# Patient Record
Sex: Female | Born: 1980 | Race: Black or African American | Hispanic: No | Marital: Single | Smoking: Current every day smoker
Health system: Southern US, Community
[De-identification: ages and names within clinical notes are randomized; demographics above are authoritative.]

---

## 2018-02-10 ENCOUNTER — Encounter (HOSPITAL_COMMUNITY): Payer: Self-pay | Admitting: Emergency Medicine

## 2018-02-10 ENCOUNTER — Emergency Department (HOSPITAL_COMMUNITY): Payer: BLUE CROSS/BLUE SHIELD

## 2018-02-10 ENCOUNTER — Emergency Department (HOSPITAL_COMMUNITY)
Admission: EM | Admit: 2018-02-10 | Discharge: 2018-02-10 | Disposition: A | Discharge status: Home / Self Care | Payer: BLUE CROSS/BLUE SHIELD | Attending: Emergency Medicine | Admitting: Emergency Medicine

## 2018-02-10 DIAGNOSIS — R10814 Left lower quadrant abdominal tenderness: Secondary | ICD-10-CM | POA: Diagnosis not present

## 2018-02-10 DIAGNOSIS — F17210 Nicotine dependence, cigarettes, uncomplicated: Secondary | ICD-10-CM | POA: Insufficient documentation

## 2018-02-10 DIAGNOSIS — R102 Pelvic and perineal pain: Secondary | ICD-10-CM | POA: Insufficient documentation

## 2018-02-10 DIAGNOSIS — R110 Nausea: Secondary | ICD-10-CM | POA: Insufficient documentation

## 2018-02-10 LAB — COMPREHENSIVE METABOLIC PANEL
ALBUMIN: 4 g/dL (ref 3.5–5.0)
ALK PHOS: 70 U/L (ref 38–126)
ALT: 16 U/L (ref 0–44)
ANION GAP: 8 (ref 5–15)
AST: 22 U/L (ref 15–41)
BILIRUBIN TOTAL: 0.7 mg/dL (ref 0.3–1.2)
BUN: 8 mg/dL (ref 6–20)
CALCIUM: 8.6 mg/dL — AB (ref 8.9–10.3)
CO2: 24 mmol/L (ref 22–32)
CREATININE: 0.76 mg/dL (ref 0.44–1.00)
Chloride: 106 mmol/L (ref 98–111)
GFR calc Af Amer: >60 mL/min (ref >60)
GFR calc non Af Amer: >60 mL/min (ref >60)
GLUCOSE: 100 mg/dL — AB (ref 70–99)
Potassium: 3.6 mmol/L (ref 3.5–5.1)
Sodium: 138 mmol/L (ref 135–145)
TOTAL PROTEIN: 7.2 g/dL (ref 6.5–8.1)

## 2018-02-10 LAB — URINALYSIS, ROUTINE W REFLEX MICROSCOPIC
Bacteria, UA: NONE SEEN
Bilirubin Urine: NEGATIVE
GLUCOSE, UA: NEGATIVE mg/dL
Ketones, ur: NEGATIVE mg/dL
Leukocytes, UA: NEGATIVE
Nitrite: NEGATIVE
PH: 5 (ref 5.0–8.0)
PROTEIN: NEGATIVE mg/dL
Specific Gravity, Urine: 1.028 (ref 1.005–1.030)

## 2018-02-10 LAB — CBC
HCT: 38.7 % (ref 36.0–46.0)
Hemoglobin: 12.4 g/dL (ref 12.0–15.0)
MCH: 30.5 pg (ref 26.0–34.0)
MCHC: 32 g/dL (ref 30.0–36.0)
MCV: 95.1 fL (ref 80.0–100.0)
PLATELETS: 203 10*3/uL (ref 150–400)
RBC: 4.07 MIL/uL (ref 3.87–5.11)
RDW: 14.4 % (ref 11.5–15.5)
WBC: 5.1 10*3/uL (ref 4.0–10.5)
nRBC: 0 % (ref 0.0–0.2)

## 2018-02-10 LAB — I-STAT BETA HCG BLOOD, ED (MC, WL, AP ONLY): I-stat hCG, quantitative: <5 m[IU]/mL (ref <5)

## 2018-02-10 LAB — LIPASE, BLOOD: Lipase: 23 U/L (ref 11–51)

## 2018-02-10 NOTE — ED Provider Notes (Signed)
[facility]-EMERGENCY DEPT Provider Note   CSN: [id number] Arrival date & time: 02/10/18  1146     History   Chief Complaint Chief Complaint  Patient presents with  . Abdominal Pain  . Nausea    HPI Robin Villanueva is a 37 y.o. female who presents to ED for gradually worsening, intermittent and left lower abdominal pain/pelvic pain for the past week.  States that she has had intermittent pain over the past year.  No specific aggravating or alleviating factors noted.  Pain is sharp, will sometimes radiate down her left leg.  She notes that she is not had a normal menstrual cycle since September 2019.  States that she will have intermittent spotting.  Reports nausea but no vomiting.  No changes to bowel movements, urinary symptoms, fever.  Prior abdominal surgeries include ovarian cyst removal but unsure of laterality.  Has been taking intermittent Tylenol with only mild improvement in symptoms.  Reports occasional alcohol use, daily tobacco use.  Denies any other drug use or chronic NSAID use, vaginal discharge.  HPI  History reviewed. No pertinent past medical history.  There are no active problems to display for this patient.   History reviewed. No pertinent surgical history.   OB History   None      Home Medications    Prior to Admission medications   Not on File    Family History No family history on file.  Social History Social History   Tobacco Use  . Smoking status: Current Every Day Smoker  . Smokeless tobacco: Never Used  Substance Use Topics  . Alcohol use: Never    Frequency: Never  . Drug use: Never     Allergies   Patient has no allergy information on record.   Review of Systems Review of Systems  Constitutional: Negative for appetite change, chills and fever.  HENT: Negative for ear pain, rhinorrhea, sneezing and sore throat.   Eyes: Negative for photophobia and visual disturbance.  Respiratory: Negative for cough,  chest tightness, shortness of breath and wheezing.   Cardiovascular: Negative for chest pain and palpitations.  Gastrointestinal: Positive for abdominal pain and nausea. Negative for blood in stool, constipation, diarrhea and vomiting.  Genitourinary: Negative for dysuria, hematuria, urgency and vaginal bleeding.       +spotting  Musculoskeletal: Negative for myalgias.  Skin: Negative for rash.  Neurological: Negative for dizziness, weakness and light-headedness.     Physical Exam Updated Vital Signs BP 139/86 (BP Location: Left Arm)   Pulse 64   Temp 98.8 F (37.1 C) (Oral)   Resp 18   Ht 5\' 6"  (1.676 m)   Wt 90.7 kg   SpO2 100%   BMI 32.28 kg/m   Physical Exam  Constitutional: She appears well-developed and well-nourished. No distress.  Nontoxic-appearing and in no acute distress.  HENT:  Head: Normocephalic and atraumatic.  Nose: Nose normal.  Eyes: Conjunctivae and EOM are normal. Left eye exhibits no discharge. No scleral icterus.  Neck: Normal range of motion. Neck supple.  Cardiovascular: Normal rate, regular rhythm, normal heart sounds and intact distal pulses. Exam reveals no gallop and no friction rub.  No murmur heard. Pulmonary/Chest: Effort normal and breath sounds normal. No respiratory distress.  Abdominal: Soft. Bowel sounds are normal. She exhibits no distension. There is tenderness in the left lower quadrant. There is no rebound and no guarding.  Musculoskeletal: Normal range of motion. She exhibits no edema.  Neurological: She is alert. She exhibits normal muscle  tone. Coordination normal.  Skin: Skin is warm and dry. No rash noted.  Psychiatric: She has a normal mood and affect.  Nursing note and vitals reviewed.    ED Treatments / Results  Labs (all labs ordered are listed, but only abnormal results are displayed) Labs Reviewed  COMPREHENSIVE METABOLIC PANEL - Abnormal; Notable for the following components:      Result Value   Glucose, Bld 100  (*)    Calcium 8.6 (*)    All other components within normal limits  URINALYSIS, ROUTINE W REFLEX MICROSCOPIC - Abnormal; Notable for the following components:   APPearance HAZY (*)    Hgb urine dipstick MODERATE (*)    All other components within normal limits  LIPASE, BLOOD  CBC  I-STAT BETA HCG BLOOD, ED ([facility], [facility], [facility] ONLY)    EKG None  Radiology US Transvaginal Non-ob  Result Date: 02/10/2018 CLINICAL DATA:  Left lower quadrant pain. EXAM: TRANSABDOMINAL AND TRANSVAGINAL ULTRASOUND OF PELVIS DOPPLER ULTRASOUND OF OVARIES TECHNIQUE: Both transabdominal and transvaginal ultrasound examinations of the pelvis were performed. Transabdominal technique was performed for global imaging of the pelvis including uterus, ovaries, adnexal regions, and pelvic cul-de-sac. It was necessary to proceed with endovaginal exam following the transabdominal exam to visualize the endometrium and ovaries. Color and duplex Doppler ultrasound was utilized to evaluate blood flow to the ovaries. COMPARISON:  None. FINDINGS: Uterus Measurements: 9.0 x 4.9 x 5.6 cm = volume: 130.09 mL. Nabothian cysts in the cervix. The uterus is anteverted. No other suspicious findings. Endometrium Thickness: 8.4 mm.  No focal abnormality visualized. Right ovary Measurements: 4.99 x 3.83 x 3.78 cm = volume: 37.83 mL. Contains a 4.2 x 3.8 x 3.1 cm simple cyst. Left ovary Measurements: 3.55 x 2.09 x 2.07 cm = volume: 8.04 mL. Normal appearance/no adnexal mass. Pulsed Doppler evaluation of both ovaries demonstrates normal low-resistance arterial and venous waveforms. Other findings No other significant abnormalities. IMPRESSION: 1. There is a dominant cyst measuring up to 4.2 cm in the right ovary. This has benign characteristics and is a common finding in premenopausal females. No imaging follow up is required. This follows consensus guidelines: Simple Adnexal Cysts: SRU Consensus Conference Update on Follow-up and Reporting. Radiology 2019;  [id number]. 2. No cause for left lower quadrant pain identified. 3. No other significant abnormalities. Electronically Signed   By: Gerome Sam III M.D   On: 02/10/2018 19:21   US Pelvis Complete  Result Date: 02/10/2018 CLINICAL DATA:  Left lower quadrant pain. EXAM: TRANSABDOMINAL AND TRANSVAGINAL ULTRASOUND OF PELVIS DOPPLER ULTRASOUND OF OVARIES TECHNIQUE: Both transabdominal and transvaginal ultrasound examinations of the pelvis were performed. Transabdominal technique was performed for global imaging of the pelvis including uterus, ovaries, adnexal regions, and pelvic cul-de-sac. It was necessary to proceed with endovaginal exam following the transabdominal exam to visualize the endometrium and ovaries. Color and duplex Doppler ultrasound was utilized to evaluate blood flow to the ovaries. COMPARISON:  None. FINDINGS: Uterus Measurements: 9.0 x 4.9 x 5.6 cm = volume: 130.09 mL. Nabothian cysts in the cervix. The uterus is anteverted. No other suspicious findings. Endometrium Thickness: 8.4 mm.  No focal abnormality visualized. Right ovary Measurements: 4.99 x 3.83 x 3.78 cm = volume: 37.83 mL. Contains a 4.2 x 3.8 x 3.1 cm simple cyst. Left ovary Measurements: 3.55 x 2.09 x 2.07 cm = volume: 8.04 mL. Normal appearance/no adnexal mass. Pulsed Doppler evaluation of both ovaries demonstrates normal low-resistance arterial and venous waveforms. Other findings No other significant abnormalities. IMPRESSION:  1. There is a dominant cyst measuring up to 4.2 cm in the right ovary. This has benign characteristics and is a common finding in premenopausal females. No imaging follow up is required. This follows consensus guidelines: Simple Adnexal Cysts: SRU Consensus Conference Update on Follow-up and Reporting. Radiology 2019; [id number]. 2. No cause for left lower quadrant pain identified. 3. No other significant abnormalities. Electronically Signed   By: Gerome Sam III M.D   On: 02/10/2018 19:21   [place]  Art/ven Flow Abd Pelv Doppler  Result Date: 02/10/2018 CLINICAL DATA:  Left lower quadrant pain. EXAM: TRANSABDOMINAL AND TRANSVAGINAL ULTRASOUND OF PELVIS DOPPLER ULTRASOUND OF OVARIES TECHNIQUE: Both transabdominal and transvaginal ultrasound examinations of the pelvis were performed. Transabdominal technique was performed for global imaging of the pelvis including uterus, ovaries, adnexal regions, and pelvic cul-de-sac. It was necessary to proceed with endovaginal exam following the transabdominal exam to visualize the endometrium and ovaries. Color and duplex Doppler ultrasound was utilized to evaluate blood flow to the ovaries. COMPARISON:  None. FINDINGS: Uterus Measurements: 9.0 x 4.9 x 5.6 cm = volume: 130.09 mL. Nabothian cysts in the cervix. The uterus is anteverted. No other suspicious findings. Endometrium Thickness: 8.4 mm.  No focal abnormality visualized. Right ovary Measurements: 4.99 x 3.83 x 3.78 cm = volume: 37.83 mL. Contains a 4.2 x 3.8 x 3.1 cm simple cyst. Left ovary Measurements: 3.55 x 2.09 x 2.07 cm = volume: 8.04 mL. Normal appearance/no adnexal mass. Pulsed Doppler evaluation of both ovaries demonstrates normal low-resistance arterial and venous waveforms. Other findings No other significant abnormalities. IMPRESSION: 1. There is a dominant cyst measuring up to 4.2 cm in the right ovary. This has benign characteristics and is a common finding in premenopausal females. No imaging follow up is required. This follows consensus guidelines: Simple Adnexal Cysts: SRU Consensus Conference Update on Follow-up and Reporting. Radiology 2019; [id number]. 2. No cause for left lower quadrant pain identified. 3. No other significant abnormalities. Electronically Signed   By: Gerome Sam III M.D   On: 02/10/2018 19:21    Procedures Procedures (including critical care time)  Medications Ordered in ED Medications - No data to display   Initial Impression / Assessment and Plan / ED Course   I have reviewed the triage vital signs and the nursing notes.  Pertinent labs & imaging results that were available during my care of the patient were reviewed by me and considered in my medical decision making (see chart for details).     37yo F presents to ED for evaluation of gradually worsening, intermittent LLQ/pelvic pain for the past week. Symptoms have been intermittent for the past year. She reports not having a normal menstrual cycle since Sept. Only spotting occasionally. She reports nausea but no vomiting. NO changes to bowel movements, urinary symptoms or vaginal discharge. On exam there is some LLQ ttp without rebound or guarding. No CVA tenderness. She is overall well appearing and afebrile. Labwork shows normal CBC, CMP, lipase and hcg. Pelvic ultrasound shows 4.2cm RIGHT ovarian cyst but no left sided findings. UA shows moderate hemoglobin but otherwise unremarkable. Spoke to the patient regarding further imaging.  She would rather follow-up with her OB/GYN and primary care provider.  I feel this is reasonable based on her reassuring lab work, vital signs and overall appearance.  Will advise her to return to ED for any severe worsening symptoms.  Patient is hemodynamically stable, in NAD, and able to ambulate in the ED. Evaluation does not show pathology that  would require ongoing emergent intervention or inpatient treatment. I explained the diagnosis to the patient. Pain has been managed and has no complaints prior to discharge. Patient is comfortable with above plan and is stable for discharge at this time. All questions were answered prior to disposition. Strict return precautions for returning to the ED were discussed. Encouraged follow up with PCP.    Portions of this note were generated with Scientist, clinical (histocompatibility and immunogenetics). Dictation errors may occur despite best attempts at proofreading.   Final Clinical Impressions(s) / ED Diagnoses   Final diagnoses:  Pelvic pain in female     ED Discharge Orders    None       Dietrich Pates, [place] 02/10/18 1959    Lorre Nick, MD 02/13/18 1600

## 2018-02-10 NOTE — ED Notes (Signed)
Patient Ultrasound being performed at bedside.

## 2018-02-10 NOTE — Discharge Instructions (Signed)
Return to ED for worsening symptoms, vomiting or coughing up blood, chest pain, shortness of breath.

## 2018-02-10 NOTE — ED Triage Notes (Signed)
Patient here from home with complaints of left sided abd pain x1 week. Nausea, no vomiting.

## 2020-03-17 IMAGING — US US ART/VEN ABD/PELV/SCROTUM DOPPLER LTD
1 series · 13 of 25 positions shown · non-contrast
Comparison: None.

CLINICAL DATA: Left lower quadrant pain.

EXAM:
TRANSABDOMINAL AND TRANSVAGINAL ULTRASOUND OF PELVIS
DOPPLER ULTRASOUND OF OVARIES
TECHNIQUE: Both transabdominal and transvaginal ultrasound examinations of the
pelvis were performed. Transabdominal technique was performed for
global imaging of the pelvis including uterus, ovaries, adnexal
regions, and pelvic cul-de-sac.
It was necessary to proceed with endovaginal exam following the
transabdominal exam to visualize the endometrium and ovaries. Color
and duplex Doppler ultrasound was utilized to evaluate blood flow to
the ovaries.

[Series 1: us art/ven abd/pelv/scrotum doppler ltd · 13 of 126 slices shown]
[im 1/126]
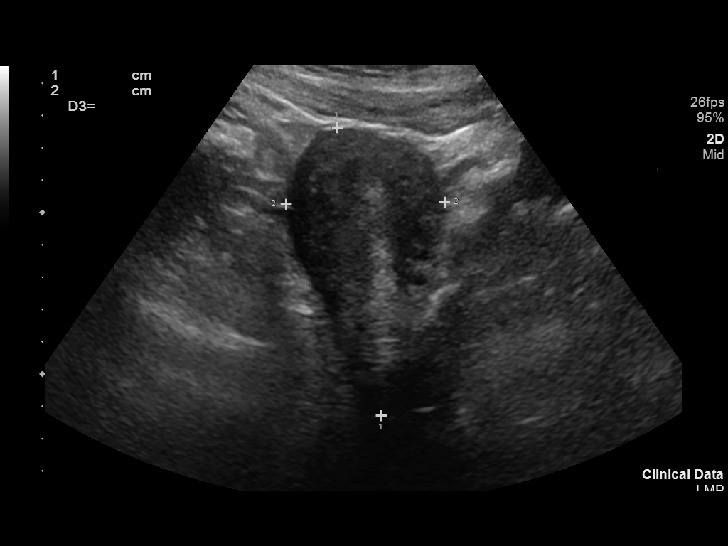
[im 11/126]
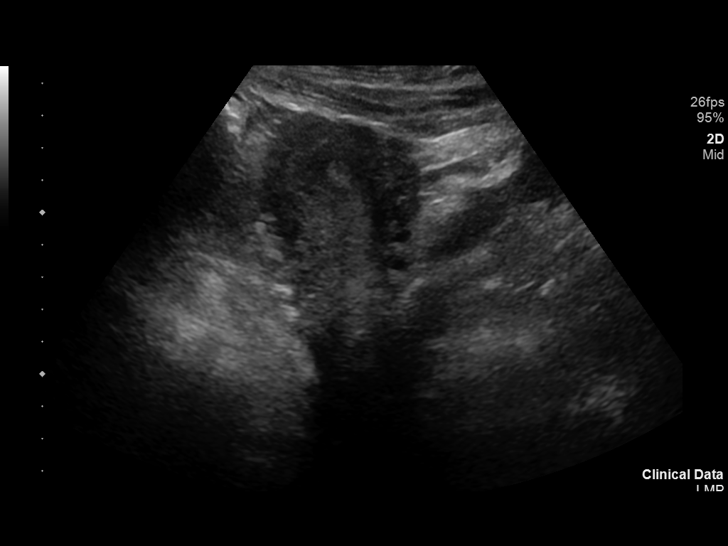
[im 21/126]
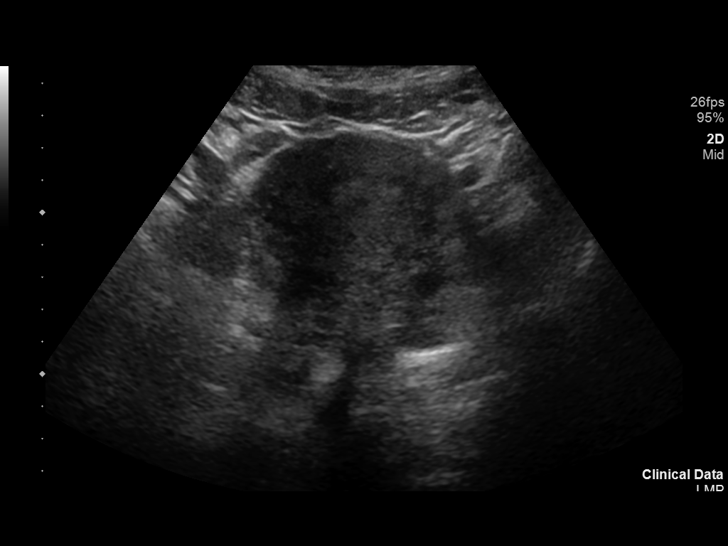
[im 32/126]
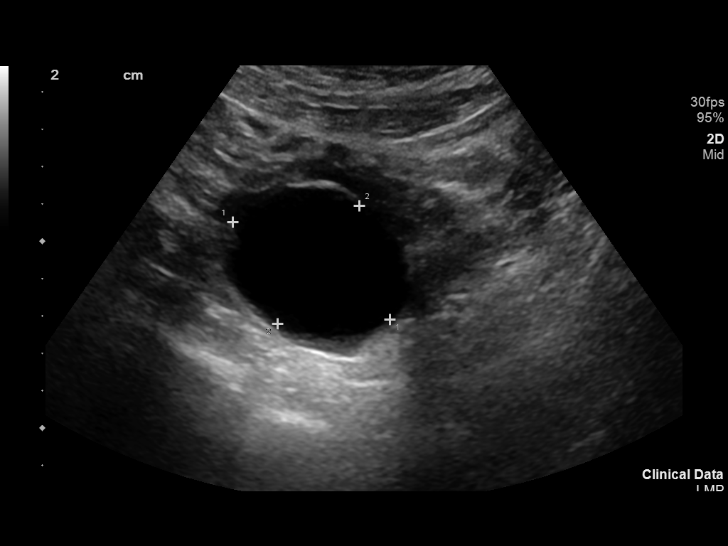
[im 42/126]
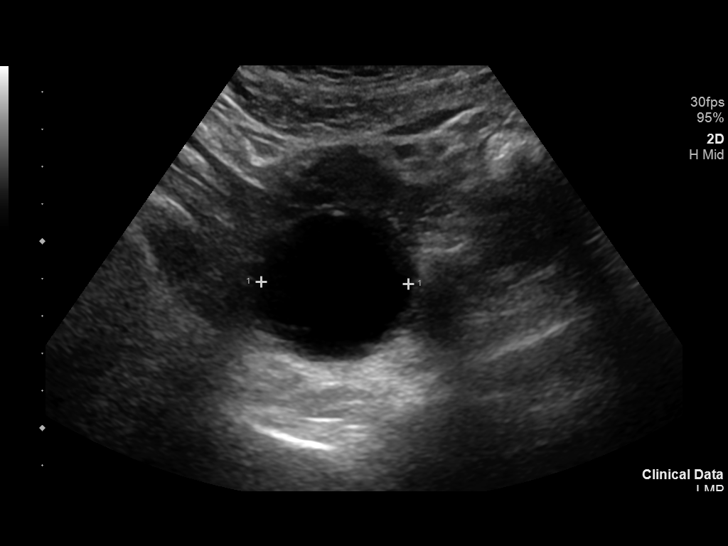
[im 53/126]
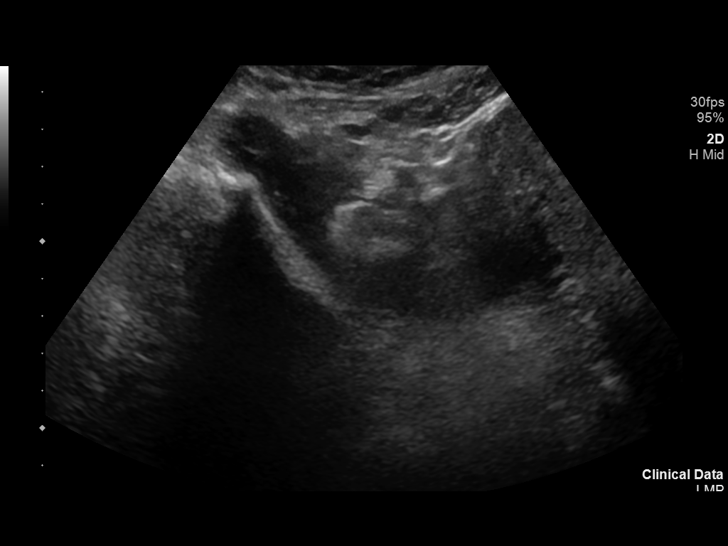
[im 63/126]
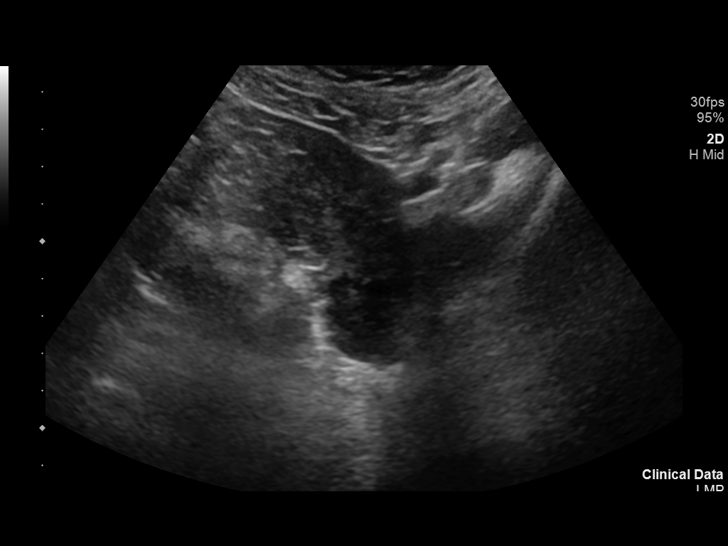
[im 73/126]
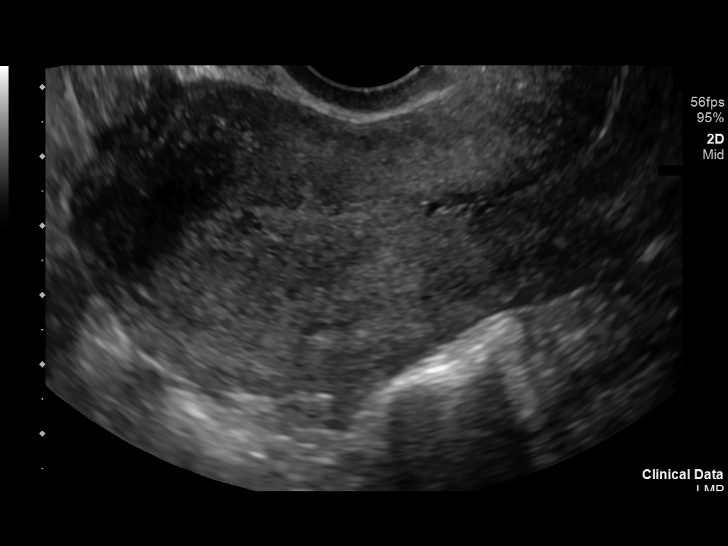
[im 84/126]
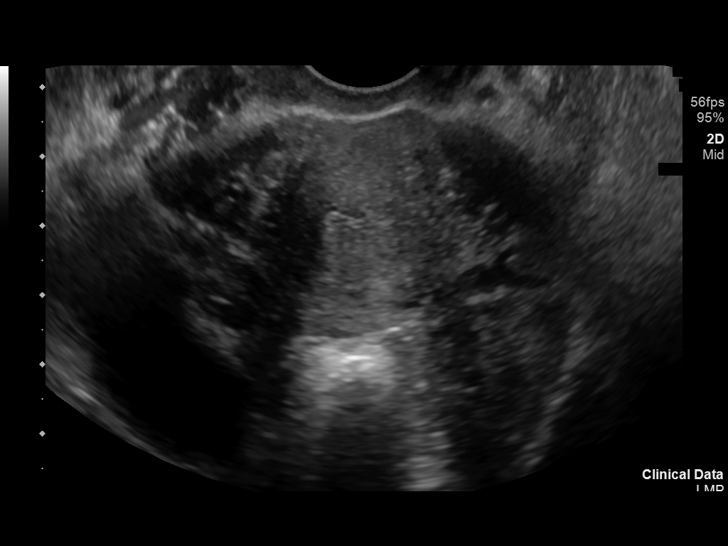
[im 94/126]
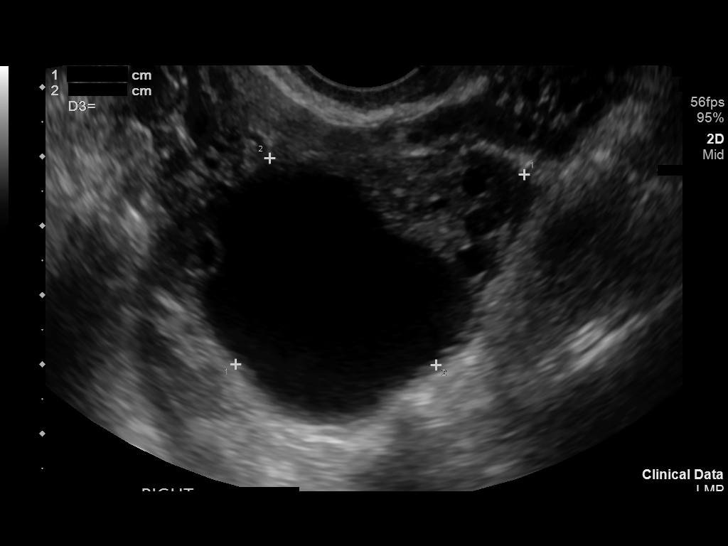
[im 105/126]
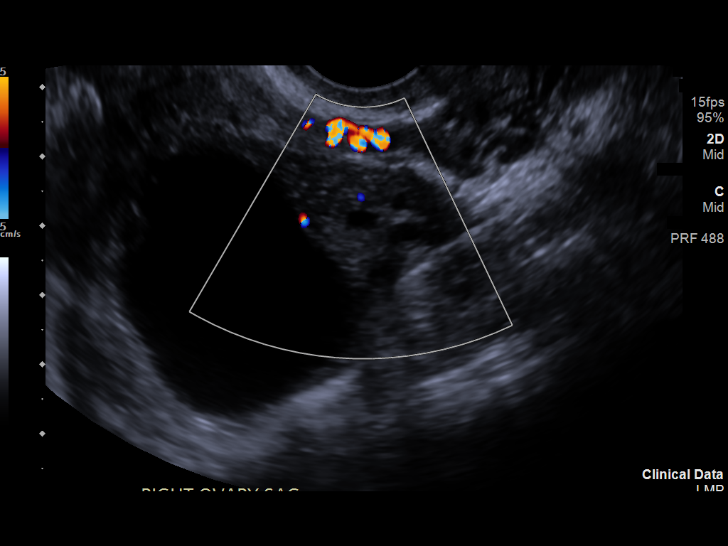
[im 115/126]
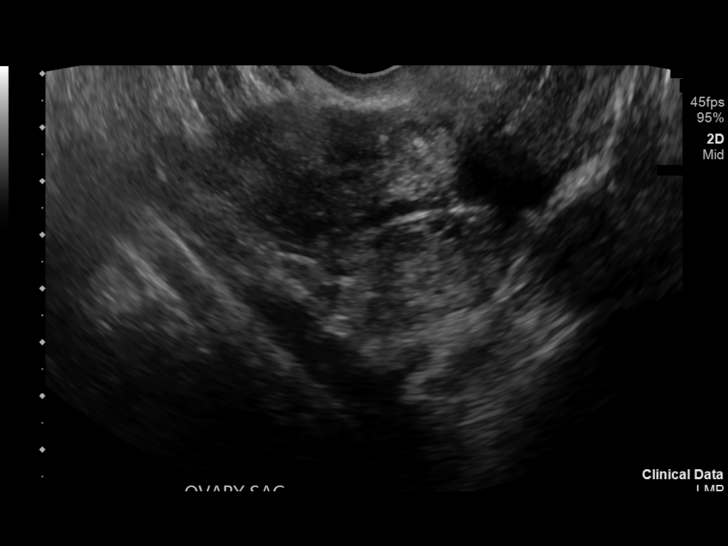
[im 126/126]
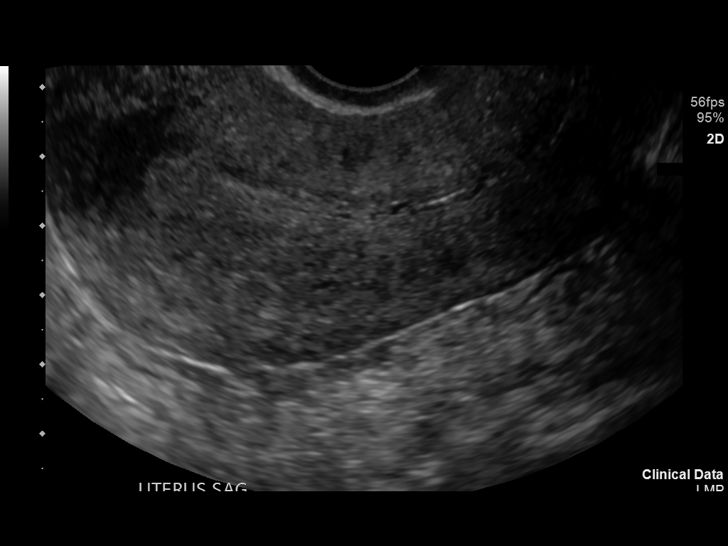

[13 of 25 positions shown; findings below may reference images not displayed]

FINDINGS: Uterus

Measurements: 9.0 x 4.9 x 5.6 cm = volume: 130.09 mL. Nabothian
cysts in the cervix. The uterus is anteverted. No other suspicious
findings.

Endometrium

Thickness: 8.4 mm.  No focal abnormality visualized.

Right ovary

Measurements: 4.99 x 3.83 x 3.78 cm = volume: 37.83 mL. Contains a
4.2 x 3.8 x 3.1 cm simple cyst.

Left ovary

Measurements: 3.55 x 2.09 x 2.07 cm = volume: 8.04 mL. Normal
appearance/no adnexal mass.

Pulsed Doppler evaluation of both ovaries demonstrates normal
low-resistance arterial and venous waveforms.

Other findings

No other significant abnormalities.
IMPRESSION: 1. There is a dominant cyst measuring up to 4.2 cm in the right
ovary. This has benign characteristics and is a common finding in
premenopausal females. No imaging follow up is required. This
follows consensus guidelines: Simple Adnexal Cysts: SRU Consensus
Conference Update on Follow-up and Reporting. Radiology [id number];
2. No cause for left lower quadrant pain identified.
3. No other significant abnormalities.
